# Patient Record
Sex: Male | Born: 1995
Health system: Southern US, Community
[De-identification: ages and names within clinical notes are randomized; demographics above are authoritative.]

---
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
## 2019-04-20 ENCOUNTER — Ambulatory Visit: Payer: Self-pay | Attending: Internal Medicine

## 2019-04-20 ENCOUNTER — Other Ambulatory Visit: Payer: Self-pay

## 2019-04-20 DIAGNOSIS — Z20822 Contact with and (suspected) exposure to covid-19: Secondary | ICD-10-CM | POA: Insufficient documentation

## 2019-04-22 LAB — NOVEL CORONAVIRUS, NAA: SARS-CoV-2, NAA: NOT DETECTED

## 2019-04-24 ENCOUNTER — Other Ambulatory Visit: Payer: Self-pay

## 2019-04-24 ENCOUNTER — Ambulatory Visit: Payer: Self-pay | Attending: Internal Medicine

## 2019-04-24 DIAGNOSIS — Z20822 Contact with and (suspected) exposure to covid-19: Secondary | ICD-10-CM

## 2019-04-24 DIAGNOSIS — U071 COVID-19: Secondary | ICD-10-CM | POA: Insufficient documentation

## 2019-04-25 LAB — NOVEL CORONAVIRUS, NAA: SARS-CoV-2, NAA: DETECTED — AB

## 2019-04-26 ENCOUNTER — Telehealth: Payer: Self-pay

## 2019-04-26 NOTE — Telephone Encounter (Signed)
[name] from [facility] called to get additional phone number for pt who needs to be informed of their covid test results. Number on file was given.   Samuel Mejia

## 2021-09-07 DIAGNOSIS — R69 Illness, unspecified: Secondary | ICD-10-CM | POA: Diagnosis not present

## 2022-02-10 DIAGNOSIS — C73 Malignant neoplasm of thyroid gland: Secondary | ICD-10-CM | POA: Diagnosis not present

## 2022-08-11 DIAGNOSIS — C73 Malignant neoplasm of thyroid gland: Secondary | ICD-10-CM | POA: Diagnosis not present
